# Patient Record
Sex: Male | Born: 1990 | Race: Black or African American | Hispanic: No | Marital: Single | Smoking: Never smoker
Health system: Southern US, Community
[De-identification: ages and names within clinical notes are randomized; demographics above are authoritative.]

## PROBLEM LIST (undated history)

## (undated) DIAGNOSIS — J302 Other seasonal allergic rhinitis: Secondary | ICD-10-CM

## (undated) HISTORY — PX: APPENDECTOMY: SHX54

---
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
## 2009-04-06 ENCOUNTER — Emergency Department: Payer: Self-pay | Admitting: Internal Medicine

## 2009-04-07 ENCOUNTER — Emergency Department: Payer: Self-pay | Admitting: Emergency Medicine

## 2009-04-14 ENCOUNTER — Ambulatory Visit: Payer: Self-pay | Admitting: Family Medicine

## 2009-11-29 ENCOUNTER — Emergency Department: Payer: Self-pay | Admitting: Emergency Medicine

## 2011-08-03 ENCOUNTER — Observation Stay: Payer: Self-pay | Admitting: Surgery

## 2011-08-03 LAB — COMPREHENSIVE METABOLIC PANEL
Albumin: 4.6 g/dL (ref 3.4–5.0)
Alkaline Phosphatase: 136 U/L (ref 50–136)
BUN: 11 mg/dL (ref 7–18)
Bilirubin,Total: 1.1 mg/dL — ABNORMAL HIGH (ref 0.2–1.0)
Calcium, Total: 9.4 mg/dL (ref 8.5–10.1)
Co2: 27 mmol/L (ref 21–32)
Creatinine: 1 mg/dL (ref 0.60–1.30)
EGFR (African American): >60
EGFR (Non-African Amer.): >60
Osmolality: 274 (ref 275–301)
Potassium: 3.8 mmol/L (ref 3.5–5.1)
SGOT(AST): 16 U/L (ref 15–37)
SGPT (ALT): 16 U/L
Sodium: 138 mmol/L (ref 136–145)
Total Protein: 8.3 g/dL — ABNORMAL HIGH (ref 6.4–8.2)

## 2011-08-03 LAB — URINALYSIS, COMPLETE
Bilirubin,UR: NEGATIVE
Blood: NEGATIVE
Nitrite: NEGATIVE
Ph: 7 (ref 4.5–8.0)
Specific Gravity: 1.028 (ref 1.003–1.030)

## 2011-08-03 LAB — CBC
HCT: 47.9 % (ref 40.0–52.0)
MCHC: 33.1 g/dL (ref 32.0–36.0)
MCV: 93 fL (ref 80–100)
Platelet: 238 10*3/uL (ref 150–440)
RBC: 5.14 10*6/uL (ref 4.40–5.90)
RDW: 12.9 % (ref 11.5–14.5)
WBC: 8.5 10*3/uL (ref 3.8–10.6)

## 2011-08-03 LAB — LIPASE, BLOOD: Lipase: 106 U/L (ref 73–393)

## 2011-08-09 LAB — PATHOLOGY REPORT

## 2014-07-24 NOTE — H&P (Signed)
History of Present Illness 22 hour h/o right flank pain that radiated to periumbilical region this AM and was associated with nausea and vomiting. Hungry. No constipation or diarrhea. Low-grade fever.    Past History allergic rhinitis   Past Med/Surgical Hx:  seasonal allergies:   heart murmur:   ALLERGIES:  No Known Allergies:   HOME MEDICATIONS: Medication Instructions Status  Zyrtec 10 mg oral tablet 1 tab(s) orally once a [place] Active   Family and Social History:   Family History Non-Contributory    Social History positive  tobacco, positive  tobacco (Current within 1 year), positive ETOH, single, lives with mother, smokes cigarettes and drinks ETOH socially, works at [employer] (within 1 year)    Place of [place]   Review of Systems:   Fever/Chills Yes    Cough No    Sputum No    Abdominal Pain Yes    Diarrhea No    Constipation No    Nausea/Vomiting Yes    SOB/DOE No    Chest Pain No    Dysuria No    Tolerating PT Yes    Tolerating Diet Nauseated  Vomiting    Medications/Allergies Reviewed Medications/Allergies reviewed   Physical Exam:   GEN well developed, well nourished, no acute distress    HEENT pink conjunctivae, PERRL, hearing intact to voice, moist oral mucosa, Oropharynx clear    NECK supple  No masses  thyroid not tender  trachea midline    RESP normal resp effort  clear BS    CARD regular rate  no thrills  no Rub    ABD positive tenderness  no rebound or guarding but some mild tenderness to deep palpation on right    GU no superpubic tenderness    EXTR negative cyanosis/clubbing, negative edema    SKIN normal to palpation, No rashes, skin turgor good    NEURO cranial nerves intact, follows commands, strength:, motor/sensory function intact    PSYCH alert, A+O to time, place, person, good insight   Routine Chem:  04-May-13 13:38    Glucose, Serum 82   BUN 11   Creatinine (comp) 1.00   Sodium,  Serum 138   Potassium, Serum 3.8   Chloride, Serum 105   CO2, Serum 27   Calcium (Total), Serum 9.4  Hepatic:  04-May-13 13:38    Bilirubin, Total 1.1   Alkaline Phosphatase 136   SGPT (ALT) 16   SGOT (AST) 16   Total Protein, Serum 8.3   Albumin, Serum 4.6  Routine Chem:  04-May-13 13:38    Osmolality (calc) 274   eGFR (African American) >60   eGFR (Non-African American) >60   Anion Gap 6  Routine Hem:  04-May-13 13:38    WBC (CBC) 8.5   RBC (CBC) 5.14   Hemoglobin (CBC) 15.8   Hematocrit (CBC) 47.9   Platelet Count (CBC) 238   MCV 93   MCH 30.8   MCHC 33.1   RDW 12.9  Routine Chem:  04-May-13 13:38    Lipase 106  Routine UA:  04-May-13 13:38    Color (UA) Yellow   Clarity (UA) Clear   Glucose (UA) Negative   Bilirubin (UA) Negative   Ketones (UA) Negative   Specific Gravity (UA) 1.028   Blood (UA) Negative   pH (UA) 7.0   Protein (UA) Negative   Nitrite (UA) Negative   Leukocyte Esterase (UA) Negative   WBC (UA) RARE   Radiology Results:  CT:    04-May-13 18:09, CT Abdomen and Pelvis With Contrast   CT Abdomen and Pelvis With Contrast   REASON FOR EXAM:    (1) right abd pain; (2) same  COMMENTS:       PROCEDURE: CT  - CT ABDOMEN / PELVIS  W  - Aug 03 2011  6:09PM     RESULT: Axial CT scanning was performed through the abdomen and pelvis   with reconstructions at 3 mm intervals and slice thicknesses following   intravenous administration of 100 cc of Isovue-370. The patient also   received oral contrast material. Review of multiplanar reconstructed   images was performed separately on the VIA monitor.    The stomach is moderatelydistended with contrast. The small bowel loops   are mildly distended and contrast has reached as far distally as the   cecum. There is a structure just inferior to the cecum seen on images 88   through 96 which may reflect an edematous appendix. Theremainder of the     colon contains a moderate amount of stool and fluid and  gas. There are   scattered diverticula in the sigmoid. I do not see objective evidence of   acute diverticulitis. There is no free fluid in the abdomen or pelvis.     The caliber of the abdominal aorta is normal. The liver, gallbladder,   spleen, pancreas, adrenal glands, and kidneys are normal in appearance.   The urinary bladder is moderately distended. The prostate gland exhibits   no acute abnormality. I see no inguinal nor umbilical hernia. The lung   bases are clear. The lumbar vertebral bodies are preserved in height.    IMPRESSION:   1. There is a structure adjacent to the inferior aspect of the cecum on   images 88 through 96 which may reflect an inflamed appendix. There is no   evidence of perforation or abscess formation. Correlation with the   patient's clinical examination and laboratory values is needed.  2. The small bowel exhibits findings suggestive of a mild ileus.  3. The remainder of the coloncontains considerable stool and fluid and   some gas. This may indicate constipation.  4. I see no acute hepatobiliary abnormality nor acute urinary tract   abnormality.    Dictation Site: 5          Verified By: DAVID A. Martinique, M.D., MD     Assessment/Admission Diagnosis possible early acute appendicitis    Plan laparoscopic appendectomy   Electronic Signatures: Consuela Mimes (MD)  (Signed [phone] 19:56)  Authored: CHIEF COMPLAINT and HISTORY, PAST MEDICAL/SURGIAL HISTORY, ALLERGIES, HOME MEDICATIONS, FAMILY AND SOCIAL HISTORY, REVIEW OF SYSTEMS, PHYSICAL EXAM, LABS, Radiology, ASSESSMENT AND PLAN   Last Updated: 04-May-13 19:56 by Consuela Mimes (MD)

## 2014-07-24 NOTE — Op Note (Signed)
PATIENT NAME:  Gregory Henry, Gregory Henry MR#:  [id number] DATE OF BIRTH:  05-25-1990  DATE OF PROCEDURE:  08/03/2011  PREOPERATIVE DIAGNOSIS: Probable early acute appendicitis.   POSTOPERATIVE DIAGNOSES:  1. Early acute appendicitis versus gastroenteritis.  2. Intraabdominal adhesions.  OPERATION PERFORMED:  1. Laparoscopic enterolysis.  2. Laparoscopic appendectomy.   SURGEON: Claude MangesWilliam F. Daekwon Beswick, MD   ANESTHESIA: General.   PROCEDURE IN DETAIL: The patient was placed supine on the operating room table and prepped and draped in the usual sterile fashion. A Hassan cannula was introduced amidst horizontal mattress sutures of 0 Vicryl in the supraumbilical midline and a 15 mmHg CO2 pneumoperitoneum was created. Two additional 5 mm trocars were placed under direct visualization. Upon entering the abdomen, there was a tiny amount of what appeared to be somewhat whitish or pus-colored peritoneal fluid but there was no excess peritoneal fluid. The ileum appeared mildly dilated, hyperemic, and hyperperistaltic. There was some adhesion from the omentum to the anterior abdominal wall in the suprapubic region and it was possible that this omentum had been wrapped around that but then reduced with the creation of a pneumoperitoneum. This adhesion was lysed with the Harmonic scalpel.   Attention was turned to the appendix and the appendix appeared very mildly dilated and slightly injected, particularly in the distal third. There was no exudate and if in fact it was acute appendicitis it was very early. The mesoappendix was taken down with the Harmonic scalpel and the appendectomy was performed at its junction with the base and the cecum utilizing the Endo GIA stapling device. The appendix was placed in an EndoCatch bag and extracted from the abdomen via the supraumbilical port. No irrigation was performed as there was no fluid collection and no exudate. The terminal ileum was inspected going backwards from the cecum for  at least 3 or 4 feet and there was no abnormalities such as creeping fat or a Meckel's diverticulum but the ileum did appear dilated and both fluid and gas filled and remained hyperperistaltic throughout the procedure. The omentum was replaced over top of the small intestine down towards the pelvis and also placed over towards the appendectomy site and the peritoneum was desufflated and decannulated after the gallbladder and duodenum were inspected and both appeared normal. The linea alba was closed with a running 0 PDS suture and the previously placed Vicryls were tied one to another and all three skin sites were closed with subcuticular 5-0 Monocryl and suture strips. The patient tolerated the procedure well. There were no complications.   ____________________________ Claude MangesWilliam F. Janmichael Giraud, MD wfm:drc D: 08/03/2011 21:51:13 ET T: 08/04/2011 10:09:15 ET JOB#: [id number]  cc: Claude MangesWilliam F. Izzah Pasqua, MD, <Dictator> Claude MangesWILLIAM F Alexah Kivett MD ELECTRONICALLY SIGNED 08/04/2011 20:01

## 2014-10-18 ENCOUNTER — Encounter: Payer: Self-pay | Admitting: Emergency Medicine

## 2014-10-18 ENCOUNTER — Emergency Department
Admission: EM | Admit: 2014-10-18 | Discharge: 2014-10-18 | Disposition: A | Discharge status: Home / Self Care | Payer: Managed Care, Other (non HMO) | Attending: Emergency Medicine | Admitting: Emergency Medicine

## 2014-10-18 ENCOUNTER — Emergency Department: Payer: Managed Care, Other (non HMO)

## 2014-10-18 DIAGNOSIS — S6992XA Unspecified injury of left wrist, hand and finger(s), initial encounter: Secondary | ICD-10-CM | POA: Diagnosis present

## 2014-10-18 DIAGNOSIS — Y9389 Activity, other specified: Secondary | ICD-10-CM | POA: Diagnosis not present

## 2014-10-18 DIAGNOSIS — S66912A Strain of unspecified muscle, fascia and tendon at wrist and hand level, left hand, initial encounter: Secondary | ICD-10-CM | POA: Insufficient documentation

## 2014-10-18 DIAGNOSIS — X58XXXA Exposure to other specified factors, initial encounter: Secondary | ICD-10-CM | POA: Diagnosis not present

## 2014-10-18 DIAGNOSIS — Y9289 Other specified places as the place of occurrence of the external cause: Secondary | ICD-10-CM | POA: Insufficient documentation

## 2014-10-18 DIAGNOSIS — Y990 Civilian activity done for income or pay: Secondary | ICD-10-CM | POA: Insufficient documentation

## 2014-10-18 DIAGNOSIS — S66911A Strain of unspecified muscle, fascia and tendon at wrist and hand level, right hand, initial encounter: Secondary | ICD-10-CM

## 2014-10-18 MED ORDER — IBUPROFEN 800 MG PO TABS: 800.0000 mg | ORAL_TABLET | Freq: Three times a day (TID) | ORAL | Status: AC | PRN

## 2014-10-18 NOTE — ED Provider Notes (Signed)
[facility] Emergency Department Provider Note  ____________________________________________  Time seen: Approximately 4:30 PM  I have reviewed the triage vital signs and the nursing notes.   HISTORY  Chief Complaint Wrist Pain   HPI Gregory Henry is a 24 y.o. male presenting to [facility] with gradual onset of left wrist pain. He noticed the pain approximately a week ago, but it increased in severity yesterday morning and has not resolved. 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 He is right handed and does most work with his right hand. He describes the pain as aching and tender and is exacerbated with movement. Pain is relieved with rest. No medication or heat/ice has been applied to the area. Pain is constant through out the [name] (not worse in AM or after work). The pain is currently a 7 out of 10. Denies fever, chills, nausea, or vomiting. No swelling or erythema.   History reviewed. No pertinent past medical history.  There are no active problems to display for this patient.   Past Surgical History  Procedure Laterality Date  . Appendectomy      Current Outpatient Rx  Name  Route  Sig  Dispense  Refill  . ibuprofen (ADVIL,MOTRIN) 800 MG tablet   Oral   Take 1 tablet (800 mg total) by mouth every 8 (eight) hours as needed.   30 tablet   0     Allergies Review of patient's allergies indicates no known allergies.  No family history on file.  Social History History  Substance Use Topics  . Smoking status: Never Smoker   . Smokeless tobacco: Not on file  . Alcohol Use: No    Review of Systems Constitutional: No fever/chills Cardiovascular: Denies chest pain. Respiratory: Denies shortness of breath. Gastrointestinal: No abdominal pain.  No nausea, no vomiting.  No diarrhea.  No constipation. Musculoskeletal: Pain in the left wrist with movement and touch. Denies  elbow or phalange tenderness.  Skin: Negative for rash. Neurological: Negative for headaches, focal weakness or numbness.   ____________________________________________   PHYSICAL EXAM:  VITAL SIGNS: ED Triage Vitals  Enc Vitals Group     BP 10/18/14 1620 148/92 mmHg     Pulse Rate 10/18/14 1620 61     Resp 10/18/14 1620 16     Temp 10/18/14 1620 98.6 F (37 C)     Temp Source 10/18/14 1620 Oral     SpO2 10/18/14 1620 100 %     Weight 10/18/14 1620 188 lb (85.276 kg)     Height 10/18/14 1620  (1.753 m)     Head Cir --      Peak Flow --      Pain Score 10/18/14 1621 7     Pain Loc --      Pain Edu? --      Excl. in GC? --     Constitutional: Alert and oriented. Well appearing and in no acute distress.   Cardiovascular: Normal rate, regular rhythm. Grossly normal heart sounds.  Good peripheral circulation. Radial pulses equal bilaterally.  Respiratory: Normal respiratory effort.  No retractions. Lungs CTAB. Musculoskeletal: Right wrist WNL. Left wrist joint tender to palpation. No erythema or effusion noted. No swelling. Pain exacerbated with pronation, supination, and flexion of the wrist joint. Unable to complete phalen's test due to pain. Negative Tinnel's sign. ROM appropriate in digits.  Neurologic:  Normal speech  and language. No gross focal neurologic deficits are appreciated. No gait instability. Skin:  Skin is warm, dry and intact. No rash noted. No evidence of erythema, edema, or septic joint on left wrist.  Psychiatric: Mood and affect are normal. Speech and behavior are normal.  ____________________________________________   LABS (all labs ordered are listed, but only abnormal results are displayed)  Labs Reviewed - No data to display ____________________________________________   RADIOLOGY  Normal exam, no fracture or dislocation. Interpreted by radiologist and reviewed by  myself. ____________________________________________   PROCEDURES  Procedure(s) performed: None  Critical Care performed: No  ____________________________________________   INITIAL IMPRESSION / ASSESSMENT AND PLAN / ED COURSE  Pertinent labs & imaging results that were available during my care of the patient were reviewed by me and considered in my medical decision making (see chart for details).  He left wrist sprain. We'll provide cockup wrist splint to use as needed for comfort Rx given for Motrin 800 mg 3 times a [place]. Patient voices no other emergency medical complaints at this time and will return to the ER with any worsening symptomology. ____________________________________________   FINAL CLINICAL IMPRESSION(S) / ED DIAGNOSES  Final diagnoses:  Wrist strain, right, initial encounter      Evangeline DakinCharles M Beers, PA-C 10/18/14 1718  Minna AntisKevin Paduchowski, MD 10/18/14 2219

## 2014-10-18 NOTE — Discharge Instructions (Signed)
Wrist Splint A wrist splint holds your wrist in a set position so that it does not move (fixed position). It can help broken bones and sprains heal faster, with less pain. It can also help relieve pressure on the nerve that runs down the middle of your arm (median nerve) into your fingers.  HOME CARE  Wear your splint as told by your doctor. It may be worn while you sleep.  Exercise your wrist as told by your doctor. These exercises help keep muscle strength in your hand and wrist. They also help to make sure you keep motion in your fingers. GET HELP RIGHT AWAY IF:   You start to lose feeling in your hand or fingers.  Your skin or fingernails turn blue or gray, or they feel cold. MAKE SURE YOU:   Understand these instructions.  Will watch your condition.  Will get help right away if you are not doing well or get worse. Document Released: 09/04/2007 Document Revised: 06/10/2011 Document Reviewed: 06/29/2013 [facility] Patient Information 2015 [place], [place]. This information is not intended to replace advice given to you by your health care provider. Make sure you discuss any questions you have with your health care provider.  Wrist Pain A wrist sprain happens when the bands of tissue that hold the wrist joints together (ligament) stretch too much or tear. A wrist strain happens when muscles or bands of tissue that connect muscles to bones (tendons) are stretched or pulled. HOME CARE  Put ice on the injured area.  Put ice in a plastic bag.  Place a towel between your skin and the bag.  Leave the ice on for 15-20 minutes, 03-04 times a [place], for the first 2 days.  Raise (elevate) the injured wrist to lessen puffiness (swelling).  Rest the injured wrist for at least 48 hours or as told by your doctor.  Wear a splint, cast, or an elastic wrap as told by your doctor.  Only take medicine as told by your doctor.  Follow up with your doctor as told. This is important. GET HELP RIGHT  AWAY IF:   The fingers are puffy, very red, white, or cold and blue.  The fingers lose feeling (numb) or tingle.  The pain gets worse.  It is hard to move the fingers. MAKE SURE YOU:   Understand these instructions.  Will watch your condition.  Will get help right away if you are not doing well or get worse. Document Released: 09/04/2007 Document Revised: 06/10/2011 Document Reviewed: 05/09/2010 [facility] Patient Information 2015 [place], [place]. This information is not intended to replace advice given to you by your health care provider. Make sure you discuss any questions you have with your health care provider.

## 2014-10-18 NOTE — ED Notes (Signed)
Pt reports left wrist pain x1 week; reports yesterday and today he's been unable to move wrist and heard a "pop" noise... 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 Pt denies any recent known injury.

## 2016-09-26 IMAGING — CR DG WRIST COMPLETE 3+V*L*
1 series · 4 of 4 positions shown · non-contrast
Comparison: None

CLINICAL DATA: LEFT wrist pain for 1 week, heard a pop yesterday
and has been unable to move it today, works at lumbar yard, lots of
repetitive movement

EXAM:
LEFT WRIST - COMPLETE 3+ VIEW

[Series 1: pa · 0.17mm/px · 4 of 4 slices shown]
[im 1/4]
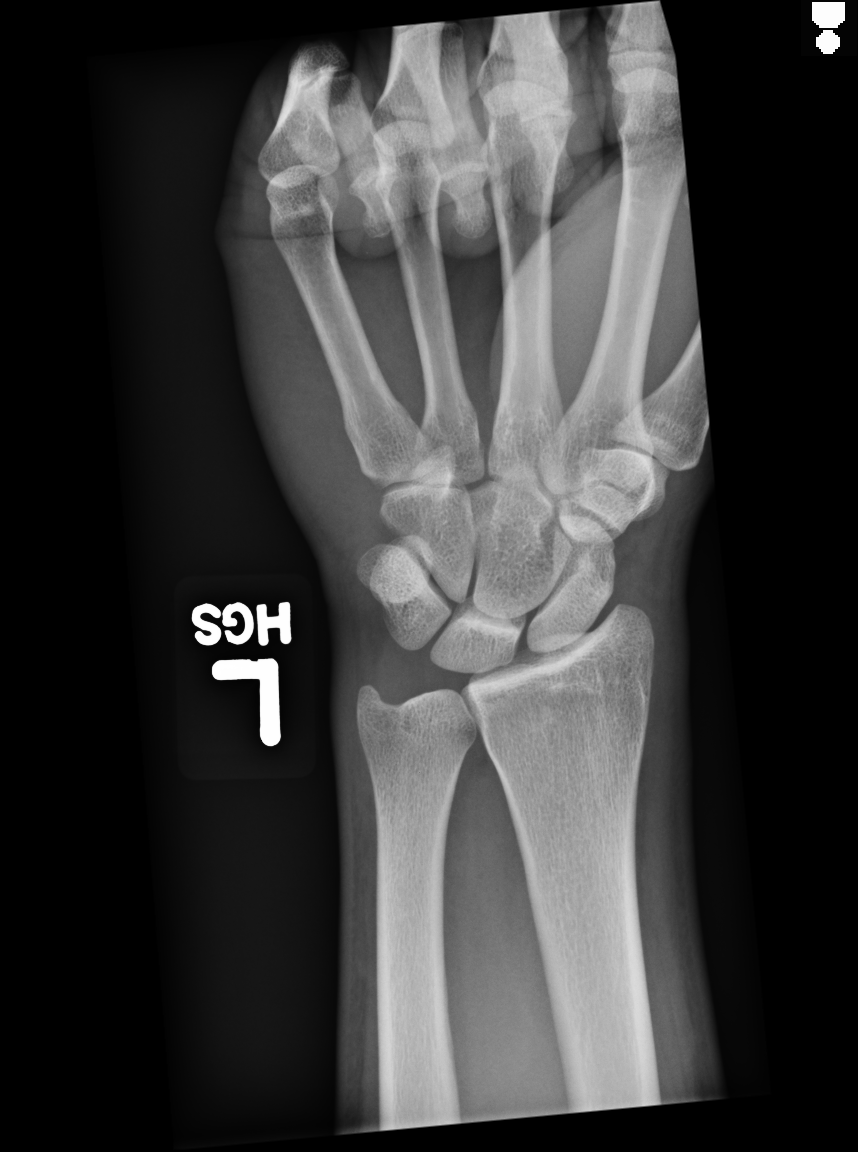
[im 2/4]
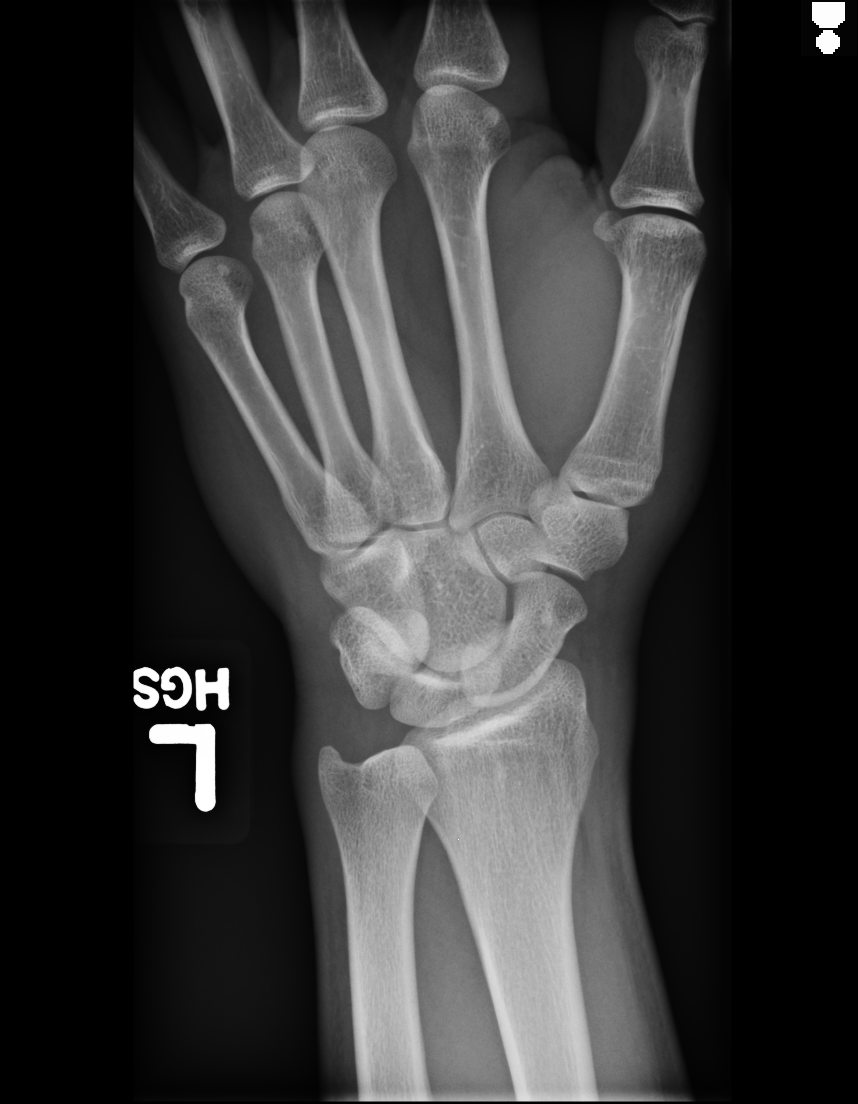
[im 3/4]
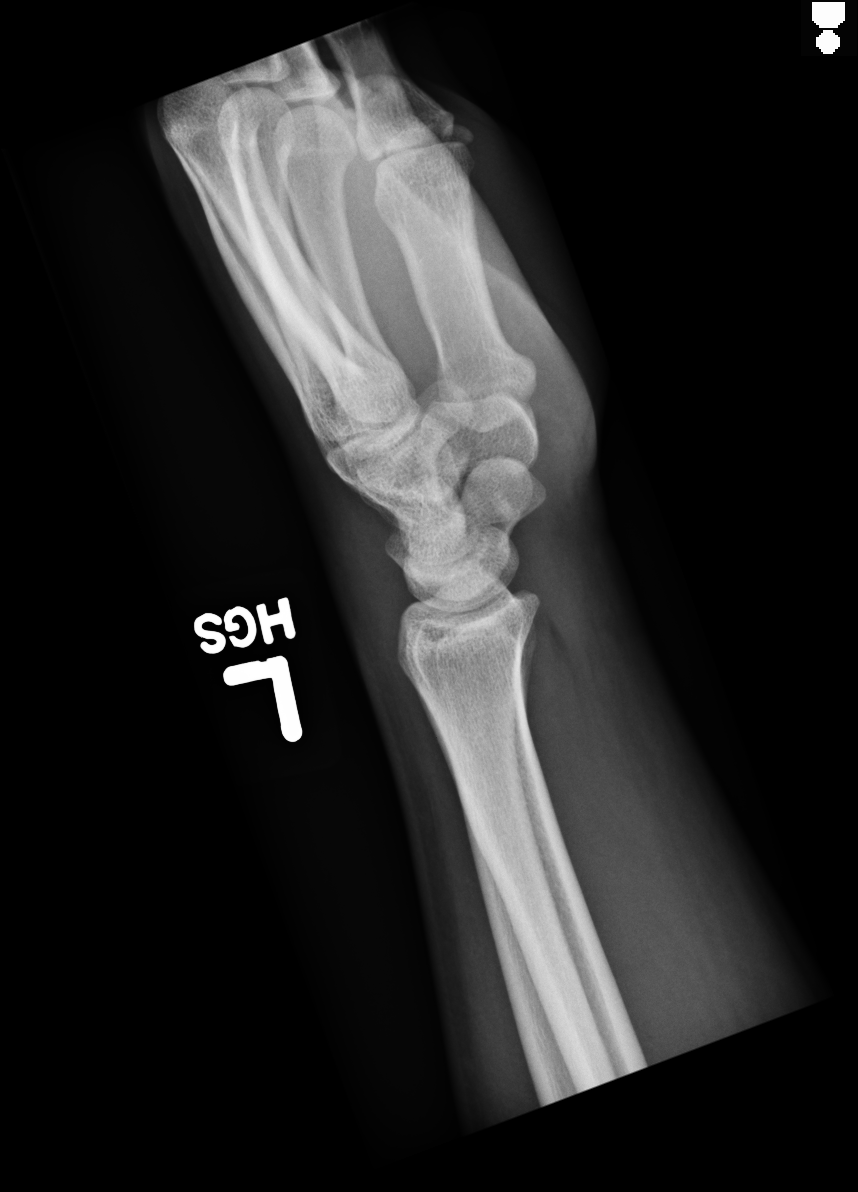
[im 4/4]
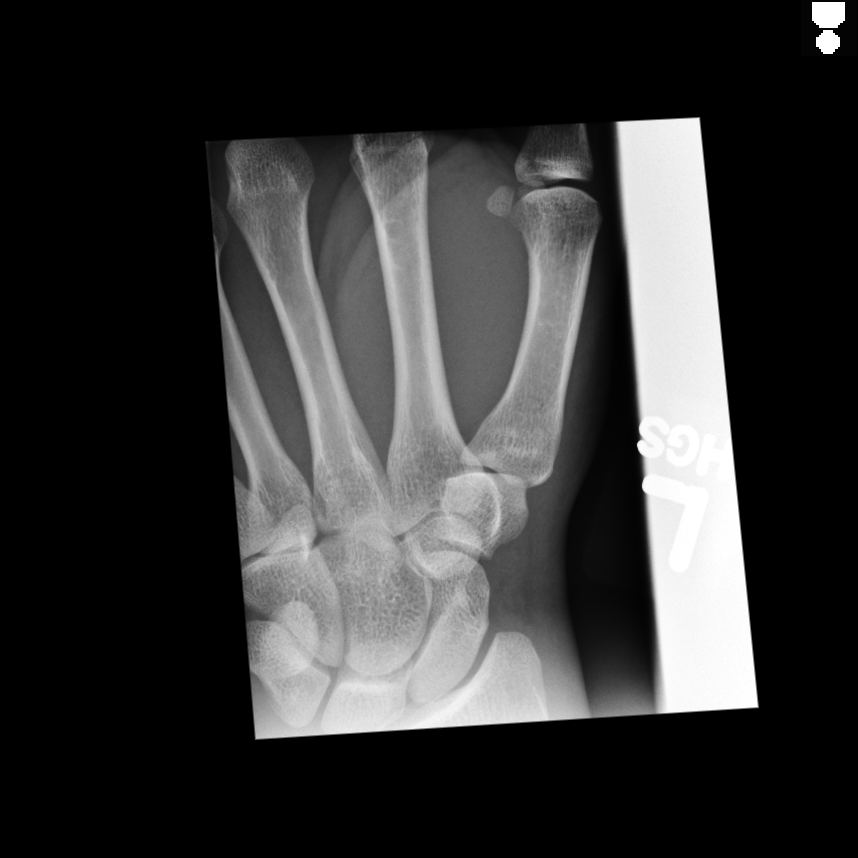

[4 of 4 positions shown; findings below may reference images not displayed]

FINDINGS: Osseous mineralization normal.

Joint spaces preserved.

No fracture, dislocation, or bone destruction.
IMPRESSION: Normal exam.

## 2019-01-24 ENCOUNTER — Encounter: Payer: Self-pay | Admitting: Emergency Medicine

## 2019-01-24 ENCOUNTER — Ambulatory Visit
Admission: EM | Admit: 2019-01-24 | Discharge: 2019-01-24 | Disposition: A | Discharge status: Home / Self Care | Payer: Managed Care, Other (non HMO) | Attending: Emergency Medicine | Admitting: Emergency Medicine

## 2019-01-24 ENCOUNTER — Other Ambulatory Visit: Payer: Self-pay

## 2019-01-24 DIAGNOSIS — Y999 Unspecified external cause status: Secondary | ICD-10-CM

## 2019-01-24 DIAGNOSIS — S61237A Puncture wound without foreign body of left little finger without damage to nail, initial encounter: Secondary | ICD-10-CM

## 2019-01-24 DIAGNOSIS — T148XXA Other injury of unspecified body region, initial encounter: Secondary | ICD-10-CM

## 2019-01-24 HISTORY — DX: Other seasonal allergic rhinitis: J30.2

## 2019-01-24 NOTE — ED Provider Notes (Signed)
[facility] - [place], [place]   Name: Gregory Henry DOB: 04-12-1990 MRN: [id number] CSN: [id number] PCP: Patient, No Pcp Per  Arrival date and time:  01/24/19 1543  Chief Complaint:  hand wound   NOTE: Prior to seeing the patient today, I have reviewed the triage nursing documentation and vital signs. Clinical staff has updated patient's PMH/PSHx, current medication list, and drug allergies/intolerances to ensure comprehensive history available to assist in medical decision making.   History:   HPI: [name] is a 28 y.o. male who presents today with complaints of an non-healing puncture wound. He first noticed the would Tuesday night when he was brushings his teeth. He's unaware of how the wound occurred. He thought the wound was healing, but there are moments when the would will start bleeding spontaneously. He tried band-aids to help with healing, but no changes noticed. His family encouraged him to seek further care.     Past Medical History:  Diagnosis Date  . Seasonal allergies     Past Surgical History:  Procedure Laterality Date  . APPENDECTOMY      Family History  Problem Relation Age of Onset  . Osteoarthritis Mother   . Heart attack Father 25  . Liver disease Father   . Alcohol abuse Father     Social History   Tobacco Use  . Smoking status: Former Smoker    Quit date: 01/23/2014    Years since quitting: 5.0  . Smokeless tobacco: Never Used  Substance Use Topics  . Alcohol use: No  . Drug use: No    There are no active problems to display for this patient.   Home Medications:    No outpatient medications have been marked as taking for the 01/24/19 encounter [facility] Encounter).    Allergies:   Patient has no known allergies.  Review of Systems (ROS): Review of Systems  Skin: Positive for wound.  All other systems reviewed and are negative.    Vital Signs: Today's Vitals   01/24/19 1554 01/24/19 1555  BP:  118/84  Pulse:   80  Resp:  18  Temp:  98.7 F (37.1 C)  TempSrc:  Oral  SpO2:  98%  Weight: 194 lb (88 kg)   Height: 5\' 9"  (1.753 m)   PainSc: 0-No pain     Physical Exam: Physical Exam Vitals signs and nursing note reviewed.  Constitutional:      Appearance: Normal appearance.  Skin:    General: Skin is warm and dry.     Findings: Wound present.     Comments: Pinpoint puncture wound to lateral aspect of left pinky finger. No bleeding when manual pressure applied.  Neurological:     Mental Status: He is alert.      Urgent Care Treatments / Results:   LABS: PLEASE NOTE: all labs that were ordered this encounter are listed, however only abnormal results are displayed. Labs Reviewed - No data to display  EKG: -None  RADIOLOGY: No results found.  PROCEDURES: Wound Care  Date/Time: 01/24/2019 4:13 PM Performed by: Gregory Baron, NP Authorized by: Gregory Baron, NP   Consent:    Consent obtained:  Verbal   Consent given by:  Patient   Alternatives discussed:  No treatment Procedure details:    Wound age (days):  6   Debridement level: subcutaneous tissue   Skin layer closed with:    Wound care performed:  Steri-strips placed (Dermabond applied) Dressing:    Wrapped with:  Elastic bandage 2 inch    MEDICATIONS RECEIVED THIS VISIT: Medications - No data to display  PERTINENT CLINICAL COURSE NOTES/UPDATES:   Initial Impression / Assessment and Plan / Urgent Care Course:  Pertinent labs & imaging results that were available during my care of the patient were personally reviewed by me and considered in my medical decision making (see lab/imaging section of note for values and interpretations).  Gregory Henry is a 28 y.o. male who presents to [facility] today with complaints of hand wound, diagnosed with puncture wound, and treated as such with the procedure above. NP and patient reviewed discharge instructions below during visit.   Patient is well appearing  overall in clinic today. He does not appear to be in any acute distress. Presenting symptoms (see HPI) and exam as documented above.   I have reviewed the follow up and strict return precautions for any new or worsening symptoms. Patient is aware of symptoms that would be deemed urgent/emergent, and would thus require further evaluation either here or in the emergency department. At the time of discharge, he verbalized understanding and consent with the discharge plan as it was reviewed with him. All questions were fielded by provider and/or clinic staff prior to patient discharge.    Final Clinical Impressions / Urgent Care Diagnoses:   Final diagnoses:  Puncture wound    New Prescriptions:  [place] Controlled Substance Registry consulted? Not Applicable  No orders of the defined types were placed in this encounter.     Discharge Instructions     Keep bandaid on area for the rest of the [place].   The Steri-strip will fall off on its own.     Recommended Follow up Care:  Patient encouraged to follow up with the following provider within the specified time frame, or sooner as dictated by the severity of his symptoms. As always, he was instructed that for any urgent/emergent care needs, he should seek care either here or in the emergency department for more immediate evaluation.   Bailey Mech, DNP, NP-c    Bailey Mech, NP 01/24/19 [phone]

## 2019-01-24 NOTE — Discharge Instructions (Addendum)
Keep bandaid on area for the rest of the [place].   The Steri-strip will fall off on its own.

## 2019-01-24 NOTE — ED Triage Notes (Signed)
Patient in today with a 6 Doughman history of a wound on his left hand. Patient denies pain but states that the area will just start bleeding.

## 2019-08-21 ENCOUNTER — Ambulatory Visit: Payer: Self-pay | Attending: Internal Medicine

## 2019-08-21 ENCOUNTER — Other Ambulatory Visit: Payer: Self-pay

## 2019-08-21 DIAGNOSIS — Z23 Encounter for immunization: Secondary | ICD-10-CM

## 2019-08-21 NOTE — Progress Notes (Signed)
   [id number]  Name:  Jody Silas.    MRN: [id number] DOB: 1991/01/31  08/21/2019  Mr. Bosler was observed post Covid-19 immunization for 15 minutes without incident. He was provided with Vaccine Information Sheet and instruction to access the V-Safe system.   Mr. Herbers was instructed to call 911 with any severe reactions post vaccine: Marland Kitchen Difficulty breathing  . Swelling of face and throat  . A fast heartbeat  . A bad rash all over body  . Dizziness and weakness   Immunizations Administered    Name Date Dose VIS Date Route   Pfizer COVID-19 Vaccine 08/21/2019  9:15 AM 0.3 mL 05/26/2018 Intramuscular   Manufacturer: [employer], [employer]   Lot: [id number]   NDC: [address]-5

## 2019-09-11 ENCOUNTER — Ambulatory Visit: Payer: Self-pay | Attending: Internal Medicine

## 2019-09-11 DIAGNOSIS — Z23 Encounter for immunization: Secondary | ICD-10-CM

## 2019-09-11 NOTE — Progress Notes (Signed)
   [id number]  Name:  Gregory Henry.    MRN: [id number] DOB: 1990/05/25  09/11/2019  Gregory Henry was observed post Covid-19 immunization for 15 minutes without incident. He was provided with Vaccine Information Sheet and instruction to access the V-Safe system.   Gregory Henry was instructed to call 911 with any severe reactions post vaccine: Marland Kitchen Difficulty breathing  . Swelling of face and throat  . A fast heartbeat  . A bad rash all over body  . Dizziness and weakness   Immunizations Administered    Name Date Dose VIS Date Route   Pfizer COVID-19 Vaccine 09/11/2019  9:48 AM 0.3 mL 05/26/2018 Intramuscular   Manufacturer: [employer], [employer]   Lot: [id number]   NDC: [address]-1

## 2020-03-25 ENCOUNTER — Encounter (HOSPITAL_COMMUNITY): Payer: Self-pay | Admitting: Emergency Medicine

## 2020-03-25 ENCOUNTER — Emergency Department (HOSPITAL_COMMUNITY)
Admission: EM | Admit: 2020-03-25 | Discharge: 2020-03-25 | Disposition: A | Discharge status: Home / Self Care | Payer: Self-pay | Attending: Emergency Medicine | Admitting: Emergency Medicine

## 2020-03-25 ENCOUNTER — Other Ambulatory Visit: Payer: Self-pay

## 2020-03-25 DIAGNOSIS — J101 Influenza due to other identified influenza virus with other respiratory manifestations: Secondary | ICD-10-CM

## 2020-03-25 DIAGNOSIS — J09X2 Influenza due to identified novel influenza A virus with other respiratory manifestations: Secondary | ICD-10-CM | POA: Insufficient documentation

## 2020-03-25 DIAGNOSIS — Z20822 Contact with and (suspected) exposure to covid-19: Secondary | ICD-10-CM | POA: Insufficient documentation

## 2020-03-25 LAB — RESP PANEL BY RT-PCR (FLU A&B, COVID) ARPGX2
Influenza A by PCR: POSITIVE — AB
Influenza B by PCR: NEGATIVE
SARS Coronavirus 2 by RT PCR: NEGATIVE

## 2020-03-25 MED ORDER — ACETAMINOPHEN 325 MG PO TABS
650.0000 mg | ORAL_TABLET | Freq: Once | ORAL | Status: AC
  Administered 2020-03-25: 650 mg via ORAL
  Filled 2020-03-25: qty 2

## 2020-03-25 NOTE — ED Triage Notes (Signed)
Patient requesting Covid test reports fever with occasional dry cough this week , respirations unlabored .

## 2020-03-26 NOTE — ED Provider Notes (Signed)
[facility] EMERGENCY DEPARTMENT Provider Note   CSN: [id number] Arrival date & time: 03/25/20  1935     History Chief Complaint  Patient presents with  . Covid test / Fever    Cloyd Stagers Runde Montez Hageman. is a 29 y.o. male.  The history is provided by the patient.  URI Presenting symptoms: congestion, cough, fatigue, fever, rhinorrhea and sore throat   Severity:  Moderate Onset quality:  Gradual Duration:  2 days Timing:  Constant Progression:  Unchanged Chronicity:  New Relieved by:  Nothing Worsened by:  Nothing Ineffective treatments:  OTC medications Associated symptoms: headaches and myalgias   Associated symptoms: no neck pain and no wheezing   Risk factors: no sick contacts   Risk factors comment:  Vaccinated against covid      History reviewed. No pertinent past medical history.  There are no problems to display for this patient.   History reviewed. No pertinent surgical history.     No family history on file.  Social History   Tobacco Use  . Smoking status: Never Smoker  . Smokeless tobacco: Never Used  Substance Use Topics  . Alcohol use: Never  . Drug use: Never    Home Medications Prior to Admission medications   Not on File    Allergies    Patient has no known allergies.  Review of Systems   Review of Systems  Constitutional: Positive for fatigue and fever.  HENT: Positive for congestion, rhinorrhea and sore throat.   Respiratory: Positive for cough. Negative for wheezing.   Musculoskeletal: Positive for myalgias. Negative for neck pain.  Neurological: Positive for headaches.  All other systems reviewed and are negative.   Physical Exam Updated Vital Signs BP 121/77 (BP Location: Right Arm)   Pulse 92   Temp (!) 100.6 F (38.1 C) (Oral)   Resp 18   SpO2 99%   Physical Exam Vitals and nursing note reviewed.  Constitutional:      General: He is not in acute distress.    Appearance: He is well-developed,  normal weight and well-nourished.  HENT:     Head: Normocephalic and atraumatic.     Mouth/Throat:     Mouth: Oropharynx is clear and moist.  Eyes:     Extraocular Movements: EOM normal.     Conjunctiva/sclera: Conjunctivae normal.     Pupils: Pupils are equal, round, and reactive to light.  Cardiovascular:     Rate and Rhythm: Regular rhythm. Tachycardia present.     Pulses: Intact distal pulses.     Heart sounds: No murmur heard.   Pulmonary:     Effort: Pulmonary effort is normal. No respiratory distress.     Breath sounds: Normal breath sounds. No wheezing or rales.  Abdominal:     General: There is no distension.     Palpations: Abdomen is soft.     Tenderness: There is no abdominal tenderness. There is no guarding or rebound.  Musculoskeletal:        General: No tenderness or edema. Normal range of motion.     Cervical back: Normal range of motion and neck supple.  Skin:    General: Skin is warm and dry.     Findings: No erythema or rash.  Neurological:     General: No focal deficit present.     Mental Status: He is alert and oriented to person, place, and time. Mental status is at baseline.  Psychiatric:        Mood and  Affect: Mood and affect and mood normal.        Behavior: Behavior normal.        Thought Content: Thought content normal.     ED Results / Procedures / Treatments   Labs (all labs ordered are listed, but only abnormal results are displayed) Labs Reviewed  RESP PANEL BY RT-PCR (FLU A&B, COVID) ARPGX2 - Abnormal; Notable for the following components:      Result Value   Influenza A by PCR POSITIVE (*)    All other components within normal limits    EKG None  Radiology No results found.  Procedures Procedures (including critical care time)  Medications Ordered in ED Medications  acetaminophen (TYLENOL) tablet 650 mg (650 mg Oral Given 03/25/20 1957)    ED Course  I have reviewed the triage vital signs and the nursing  notes.  Pertinent labs & imaging results that were available during my care of the patient were reviewed by me and considered in my medical decision making (see chart for details).    MDM Rules/Calculators/A&P                          Pt with symptoms consistent with viral syndrome.  Well appearing here.  No signs of breathing difficulty  No signs of pharyngitis, otitis or abnormal abdominal findings.  Initially some tachycardia which resolved with fever control.  COVID neg but pt with Influenza A.  D/ced with supportive care.  Final Clinical Impression(s) / ED Diagnoses Final diagnoses:  Influenza A    Rx / DC Orders ED Discharge Orders    None       Gwyneth Sprout, MD 03/26/20 [phone]

## 2020-04-07 ENCOUNTER — Encounter: Payer: Self-pay | Admitting: Emergency Medicine

## 2020-08-09 ENCOUNTER — Other Ambulatory Visit: Payer: Self-pay

## 2020-08-09 ENCOUNTER — Encounter (HOSPITAL_COMMUNITY): Payer: Self-pay | Admitting: Emergency Medicine

## 2020-08-09 ENCOUNTER — Emergency Department (HOSPITAL_COMMUNITY)
Admission: EM | Admit: 2020-08-09 | Discharge: 2020-08-09 | Disposition: A | Discharge status: Home / Self Care | Payer: Self-pay | Attending: Emergency Medicine | Admitting: Emergency Medicine

## 2020-08-09 ENCOUNTER — Emergency Department (HOSPITAL_COMMUNITY): Payer: Self-pay

## 2020-08-09 DIAGNOSIS — U071 COVID-19: Secondary | ICD-10-CM | POA: Insufficient documentation

## 2020-08-09 DIAGNOSIS — E876 Hypokalemia: Secondary | ICD-10-CM | POA: Insufficient documentation

## 2020-08-09 DIAGNOSIS — R079 Chest pain, unspecified: Secondary | ICD-10-CM

## 2020-08-09 LAB — BASIC METABOLIC PANEL
Anion gap: 6 (ref 5–15)
BUN: 6 mg/dL (ref 6–20)
CO2: 26 mmol/L (ref 22–32)
Calcium: 9.2 mg/dL (ref 8.9–10.3)
Chloride: 106 mmol/L (ref 98–111)
Creatinine, Ser: 1.1 mg/dL (ref 0.61–1.24)
GFR, Estimated: >60 mL/min (ref >60)
Glucose, Bld: 101 mg/dL — ABNORMAL HIGH (ref 70–99)
Potassium: 3.3 mmol/L — ABNORMAL LOW (ref 3.5–5.1)
Sodium: 138 mmol/L (ref 135–145)

## 2020-08-09 LAB — CBC
HCT: 38.5 % — ABNORMAL LOW (ref 39.0–52.0)
Hemoglobin: 12.7 g/dL — ABNORMAL LOW (ref 13.0–17.0)
MCH: 30.2 pg (ref 26.0–34.0)
MCHC: 33 g/dL (ref 30.0–36.0)
MCV: 91.4 fL (ref 80.0–100.0)
Platelets: 352 10*3/uL (ref 150–400)
RBC: 4.21 MIL/uL — ABNORMAL LOW (ref 4.22–5.81)
RDW: 11.5 % (ref 11.5–15.5)
WBC: 8.4 10*3/uL (ref 4.0–10.5)
nRBC: 0 % (ref 0.0–0.2)

## 2020-08-09 LAB — TROPONIN I (HIGH SENSITIVITY): Troponin I (High Sensitivity): 3 ng/L (ref <18)

## 2020-08-09 MED ORDER — METHYLPREDNISOLONE 4 MG PO TBPK: ORAL_TABLET | ORAL | 0 refills | Status: AC

## 2020-08-09 MED ORDER — AEROCHAMBER PLUS FLO-VU LARGE MISC
Status: AC
  Administered 2020-08-09: 1
  Filled 2020-08-09: qty 1

## 2020-08-09 MED ORDER — ALBUTEROL SULFATE HFA 108 (90 BASE) MCG/ACT IN AERS
2.0000 | INHALATION_SPRAY | Freq: Once | RESPIRATORY_TRACT | Status: AC
  Administered 2020-08-09: 2 via RESPIRATORY_TRACT
  Filled 2020-08-09: qty 6.7

## 2020-08-09 MED ORDER — POTASSIUM CHLORIDE ER 10 MEQ PO TBCR: 10.0000 meq | EXTENDED_RELEASE_TABLET | Freq: Every day | ORAL | 0 refills | Status: AC

## 2020-08-09 MED ORDER — ALBUTEROL SULFATE HFA 108 (90 BASE) MCG/ACT IN AERS: 1.0000 | INHALATION_SPRAY | Freq: Four times a day (QID) | RESPIRATORY_TRACT | 2 refills | Status: AC | PRN

## 2020-08-09 NOTE — ED Triage Notes (Signed)
Pt +covid on 5/6, c/o worsening chest pain, mild shortness of breath and fever since. Symptoms initially started 5/3.

## 2020-08-09 NOTE — ED Provider Notes (Signed)
[facility] EMERGENCY DEPARTMENT Provider Note   CSN: [id number] Arrival date & time: 08/09/20  0002     History Chief Complaint  Patient presents with  . Chest Pain    Gregory Henry is a 30 y.o. male with a recent diagnosis of viral COVID-19 (symptom onset 5/3), tested positive 5/6, presenting to emergency department with chest pain and shortness of breath.  The patient is now on Ruffolo 8 of COVID illness.  He reports he has had 2 doses of the Pfizer vaccine.  He denies any prior history of pulmonary disease or smoking.  He reports that since he began having his COVID symptoms 8 days ago, he has been having intermittent chest pressure.  It is substernal does not radiate anywhere.  He has never had this pain before.  Nothing makes it better or worse.  It is not associated with inspiration or movement.  It seems to come and go at random.  He currently is asymptomatic and does not have any active pain.  He does report he has had some mild intermittent shortness of breath, myalgia, and other viral symptoms.  He denies any personal family history of cardiac disease or MI.  He denies history of reflux or heartburn.  He has not been on any medications for the COVID illness.  No hemoptysis or asymmetric LE edema. Patient denies personal or family history of DVT or PE. No recent hormone use (including OCP); travel for >6 hours; prolonged immobilization for greater than 3 days; surgeries or trauma in the last 4 weeks; or malignancy with treatment within 6 months.   HPI     Past Medical History:  Diagnosis Date  . Seasonal allergies     There are no problems to display for this patient.   Past Surgical History:  Procedure Laterality Date  . APPENDECTOMY         Family History  Problem Relation Age of Onset  . Osteoarthritis Mother   . Heart attack Father 62  . Liver disease Father   . Alcohol abuse Father     Social History   Tobacco Use  . Smoking status:  Never Smoker  . Smokeless tobacco: Never Used  Vaping Use  . Vaping Use: Never used  Substance Use Topics  . Alcohol use: Never  . Drug use: Never    Home Medications Prior to Admission medications   Medication Sig Start Date End Date Taking? Authorizing Provider  albuterol (VENTOLIN HFA) 108 (90 Base) MCG/ACT inhaler Inhale 1-2 puffs into the lungs every 6 (six) hours as needed for wheezing or shortness of breath. 08/09/20  Yes Terald Sleeper, MD  methylPREDNISolone (MEDROL DOSEPAK) 4 MG TBPK tablet Use as directed on package 08/09/20  Yes Yusef Lamp, Kermit Balo, MD  potassium chloride (KLOR-CON) 10 MEQ tablet Take 1 tablet (10 mEq total) by mouth daily for 30 doses. 08/09/20 09/08/20 Yes Silvia Hightower, Kermit Balo, MD  ibuprofen (ADVIL,MOTRIN) 800 MG tablet Take 1 tablet (800 mg total) by mouth every 8 (eight) hours as needed. 10/18/14   Beers, Charmayne Sheer, PA-C    Allergies    Patient has no known allergies.  Review of Systems   Review of Systems  Constitutional: Positive for appetite change, fatigue and fever.  Eyes: Negative for pain and visual disturbance.  Respiratory: Positive for cough, chest tightness and shortness of breath.   Cardiovascular: Positive for chest pain. Negative for palpitations.  Gastrointestinal: Negative for abdominal pain and vomiting.  Genitourinary: Negative for  dysuria and hematuria.  Musculoskeletal: Positive for arthralgias and myalgias.  Skin: Negative for color change and rash.  Neurological: Negative for syncope and speech difficulty.  Psychiatric/Behavioral: Negative for agitation and confusion.  All other systems reviewed and are negative.   Physical Exam Updated Vital Signs BP 132/77   Pulse 76   Temp 98.2 F (36.8 C) (Oral)   Resp 15   SpO2 95%   Physical Exam Constitutional:      General: He is not in acute distress. HENT:     Head: Normocephalic and atraumatic.  Eyes:     Conjunctiva/sclera: Conjunctivae normal.     Pupils: Pupils are  equal, round, and reactive to light.  Cardiovascular:     Rate and Rhythm: Normal rate and regular rhythm.  Pulmonary:     Effort: Pulmonary effort is normal. No respiratory distress.     Comments: 95% on room air Abdominal:     General: There is no distension.     Tenderness: There is no abdominal tenderness.  Skin:    General: Skin is warm and dry.  Neurological:     General: No focal deficit present.     Mental Status: He is alert. Mental status is at baseline.  Psychiatric:        Mood and Affect: Mood normal.        Behavior: Behavior normal.     ED Results / Procedures / Treatments   Labs (all labs ordered are listed, but only abnormal results are displayed) Labs Reviewed  BASIC METABOLIC PANEL - Abnormal; Notable for the following components:      Result Value   Potassium 3.3 (*)    Glucose, Bld 101 (*)    All other components within normal limits  CBC - Abnormal; Notable for the following components:   RBC 4.21 (*)    Hemoglobin 12.7 (*)    HCT 38.5 (*)    All other components within normal limits  TROPONIN I (HIGH SENSITIVITY)    EKG EKG Interpretation  Date/Time:  Wednesday Aug 09 2020 00:02:46 EDT Ventricular Rate:  102 PR Interval:  148 QRS Duration: 82 QT Interval:  332 QTC Calculation: 432 R Axis:   72 Text Interpretation: Incomplete RBBB No STEMI Confirmed by Alvester Chou [phone]) on 08/09/2020 1:28:48 AM   Radiology DG Chest Portable 1 View  Result Date: 08/09/2020 CLINICAL DATA:  30 year old male with chest pain. EXAM: PORTABLE CHEST 1 VIEW COMPARISON:  Chest radiograph dated 04/14/2009. FINDINGS: The heart size and mediastinal contours are within normal limits. Both lungs are clear. The visualized skeletal structures are unremarkable. IMPRESSION: No active disease. Electronically Signed   By: Elgie Collard M.D.   On: 08/09/2020 00:39    Procedures Procedures   Medications Ordered in ED Medications  albuterol (VENTOLIN HFA) 108 (90  Base) MCG/ACT inhaler 2 puff (2 puffs Inhalation Given 08/09/20 0236)  AeroChamber Plus Flo-Vu Large MISC (1 each  Given 08/09/20 0239)    ED Course  I have reviewed the triage vital signs and the nursing notes.  Pertinent labs & imaging results that were available during my care of the patient were reviewed by me and considered in my medical decision making (see chart for details).   This patient complains of cough, SOB, chest tightness in setting of covid viral infection.  This involves an extensive number of treatment options, and is a complaint that carries with it a high risk of complications and morbidity.  The differential diagnosis includes viral bronchitis  vs PNA vs PTX vs other  Most likely bronchospasm related to COVID.  He is not actively wheezing, but he may benefit from albuterol as well as a short course of steroids.  I personally reviewed his x-ray showing no evidence of acute bacterial pneumonia or pneumothorax.  ECG reviewed  Showing NSR - no evidence of acute pericarditis.  Troponin is 3 with 8 days of symptoms - doubt ACS.  Low risk for cardiac disease.  Doubt PE - no risk factors for PE, and CP is intermittent and non-exertional   Gregory Henry was evaluated in Emergency Department on 08/09/2020 for the symptoms described in the history of present illness. He was evaluated in the context of the global COVID-19 pandemic, which necessitated consideration that the patient might be at risk for infection with the SARS-CoV-2 virus that causes COVID-19. Institutional protocols and algorithms that pertain to the evaluation of patients at risk for COVID-19 are in a state of rapid change based on information released by regulatory bodies including the CDC and federal and state organizations. These policies and algorithms were followed during the patient's care in the ED.     Final Clinical Impression(s) / ED Diagnoses Final diagnoses:  COVID-19  Chest pain, unspecified type   Hypokalemia    Rx / DC Orders ED Discharge Orders         Ordered    potassium chloride (KLOR-CON) 10 MEQ tablet  Daily        08/09/20 0229    methylPREDNISolone (MEDROL DOSEPAK) 4 MG TBPK tablet        08/09/20 0229    albuterol (VENTOLIN HFA) 108 (90 Base) MCG/ACT inhaler  Every 6 hours PRN        08/09/20 0229           Terald Sleeper, MD 08/09/20 [phone]

## 2020-08-09 NOTE — ED Notes (Signed)
Discharge instructions discussed with pt. Pt verbalized understanding with no questions at this time. Pt was abel to demonstrate appropriate use of inhaler with spacer prior to discharge. Printed prescriptions given to pt.

## 2020-08-09 NOTE — Discharge Instructions (Addendum)
Your work-up and blood test in the ER are reassuring today.  I suspect your chest pain may be coming from bronchospasm, or spasm of your airway, related to your COVID.  I prescribed you albuterol to use as needed every 4-6 hours for the next week.  I also prescribed you a short course of steroids, to begin taking tomorrow morning.  COVID is a long illness and typically lasts 7-14 days.  Your blood test showed your potassium was a little bit low (3.3).   This is likely due from a poor diet.  I prescribed you potassium pills for the next 30 days.  Consider adding banana or other potassium foods to your diet.  This should be enough to fix your potassium issue.  You can follow-up with your primary care doctor about this.  *  If you feel that your chest pain is getting worse, you begin to feel very lightheaded, or are having worse difficulty breathing, please come back to the emergency department immediately.  These may be signs or symptoms of another emergency medical condition requiring immediate attention.
